# Patient Record
Sex: Male | Born: 1995 | Race: White | Hispanic: No | Marital: Single | Smoking: Light tobacco smoker
Health system: Southern US, Community
[De-identification: ages and names within clinical notes are randomized; demographics above are authoritative.]

---
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
## 2019-09-05 ENCOUNTER — Telehealth: Payer: Self-pay

## 2019-09-05 NOTE — Telephone Encounter (Signed)
OK to see. 

## 2019-09-05 NOTE — Telephone Encounter (Signed)
Copied from [place] [id number]. Topic: General - Inquiry >> Sep 04, 2019 12:27 PM [name] wrote: Reason for CRM: Patient has not been seen since 2016 and would like to continue seeing Dr Joseph Hashimoto . Please advise

## 2019-09-08 NOTE — Telephone Encounter (Signed)
Patient is scheduled for January 4 at 10 AM.

## 2019-10-27 ENCOUNTER — Other Ambulatory Visit: Payer: Self-pay

## 2019-10-27 ENCOUNTER — Encounter: Payer: Self-pay | Admitting: Family Medicine

## 2019-10-27 ENCOUNTER — Ambulatory Visit (INDEPENDENT_AMBULATORY_CARE_PROVIDER_SITE_OTHER): Payer: 59 | Admitting: Family Medicine

## 2019-10-27 VITALS — BP 122/80 | HR 92 | Temp 97.9°F | Ht 71.5 in | Wt 197.4 lb

## 2019-10-27 DIAGNOSIS — L729 Follicular cyst of the skin and subcutaneous tissue, unspecified: Secondary | ICD-10-CM | POA: Diagnosis not present

## 2019-10-27 DIAGNOSIS — R3915 Urgency of urination: Secondary | ICD-10-CM

## 2019-10-27 DIAGNOSIS — H9190 Unspecified hearing loss, unspecified ear: Secondary | ICD-10-CM

## 2019-10-27 DIAGNOSIS — M62838 Other muscle spasm: Secondary | ICD-10-CM | POA: Diagnosis not present

## 2019-10-27 MED ORDER — METHOCARBAMOL 500 MG PO TABS: 500.0000 mg | ORAL_TABLET | Freq: Three times a day (TID) | ORAL | 1 refills | Status: DC | PRN

## 2019-10-27 MED ORDER — OXYBUTYNIN CHLORIDE 5 MG PO TABS: 5.0000 mg | ORAL_TABLET | Freq: Three times a day (TID) | ORAL | 3 refills | Status: AC | PRN

## 2019-10-27 NOTE — Progress Notes (Signed)
Subjective:     Patient ID: Joseph Sutton, male   DOB: Jan 09, 1996, 24 y.o.   MRN: [id number]  HPI Joseph is seen to reestablish care.  Last seen here over 3 years ago.  He has history of some urinary urgency and is taken low-dose oxybutynin 5 mg once daily in the past.  He states he takes this generally about 4 times per week.  Requesting refills.  He relates that he has had some sensation of pulse or heartbeat noted in his left ear but no tinnitus.  He states he sometimes has sensation of "popping "in both ears.  He thinks he may have had some gradual decline in hearing of both ears in recent years.  Denies any complicated ear infections.  No vertigo.  No sudden hearing changes.  Third issue is he states that he noticed small cystic-like swelling right testicle recently back in October on exam.  No significant pain.  What he was palpating seem to be separate from the testicle itself.  Finally, he has had some tightness in the muscles left side of neck radiating toward the sternum for the past 2 years.  He has seen physical therapist and had some treatments that did not seem to help.  His father had sample of Robaxin 500 mg which seemed to help.  History reviewed. No pertinent past medical history. History reviewed. No pertinent surgical history.  reports that he has been smoking. He has never used smokeless tobacco. He reports current alcohol use of about 1.0 standard drinks of alcohol per week. He reports that he does not use drugs. family history is not on file. No Known Allergies   Review of Systems  Constitutional: Negative for chills and fever.  HENT: Negative for congestion, ear discharge, ear pain, sinus pressure and sinus pain.   Respiratory: Negative for cough and shortness of breath.   Cardiovascular: Negative for chest pain.  Genitourinary: Positive for urgency. Negative for dysuria and testicular pain.  Neurological: Negative for dizziness and headaches.  Hematological:  Negative for adenopathy.       Objective:   Physical Exam Vitals reviewed.  Constitutional:      Appearance: Normal appearance.  HENT:     Right Ear: Tympanic membrane normal.     Left Ear: Tympanic membrane normal.     Ears:     Comments: He has minimal nonobstructing cerumen bilaterally removed with curette without difficulty Neck:     Comments: No carotid bruits Cardiovascular:     Rate and Rhythm: Normal rate and regular rhythm.     Heart sounds: No murmur.  Pulmonary:     Effort: Pulmonary effort is normal.     Breath sounds: Normal breath sounds.  Genitourinary:    Testes: Normal.     Comments: cannot appreciate any testicular masses. Musculoskeletal:     Cervical back: Normal range of motion and neck supple. No rigidity.  Lymphadenopathy:     Cervical: No cervical adenopathy.  Neurological:     Mental Status: He is alert.        Assessment:     #1 chronic urinary urgency which has been treated in the past with low-dose Ditropan as needed  #2 question of subjective bilateral hearing loss over the past couple years  #3 concern for small right scrotal testicular swelling/cyst with normal exam today.  We explained that there are multiple benign cystic swelling such as epididymis cyst, spermatocele, varicocele, hydrocele  #4 muscle tension left neck and upper back  Plan:     -Refill oxybutynin 5 mg to take once or twice daily as needed for urine urgency  -We advised that he get set up to see audiologist for hearing assessment and he will check with insurance first  -Recommend conservative measures for his muscle tension including regular aerobic exercise, adequate sleep, adequate hydration, muscle massage.  We also agreed to trial of Robaxin 500 mg every 8 hours as needed for muscle spasm.  He is aware this may cause some sedation  -Reassurance regarding testicular exam but follow-up for any pain or other concerns  Kristian Covey MD [facility] at [facility]

## 2019-10-27 NOTE — Patient Instructions (Signed)
Recommend Audiology referral to evaluate hearing

## 2019-11-15 ENCOUNTER — Encounter: Payer: Self-pay | Admitting: Family Medicine

## 2019-11-17 ENCOUNTER — Other Ambulatory Visit: Payer: Self-pay

## 2019-11-17 MED ORDER — CYCLOBENZAPRINE HCL 10 MG PO TABS: 10.0000 mg | ORAL_TABLET | Freq: Every evening | ORAL | 0 refills | Status: DC | PRN

## 2019-12-19 ENCOUNTER — Other Ambulatory Visit: Payer: Self-pay | Admitting: Family Medicine

## 2019-12-20 NOTE — Telephone Encounter (Signed)
May refill once. 

## 2020-01-21 ENCOUNTER — Encounter: Payer: Self-pay | Admitting: Family Medicine

## 2020-02-13 ENCOUNTER — Other Ambulatory Visit: Payer: Self-pay

## 2020-02-13 ENCOUNTER — Ambulatory Visit (INDEPENDENT_AMBULATORY_CARE_PROVIDER_SITE_OTHER): Payer: 59 | Admitting: Family Medicine

## 2020-02-13 ENCOUNTER — Encounter: Payer: Self-pay | Admitting: Family Medicine

## 2020-02-13 VITALS — BP 118/68 | HR 105 | Temp 98.2°F | Wt 196.7 lb

## 2020-02-13 DIAGNOSIS — L729 Follicular cyst of the skin and subcutaneous tissue, unspecified: Secondary | ICD-10-CM | POA: Diagnosis not present

## 2020-02-13 DIAGNOSIS — R6882 Decreased libido: Secondary | ICD-10-CM

## 2020-02-13 DIAGNOSIS — R5383 Other fatigue: Secondary | ICD-10-CM

## 2020-02-13 DIAGNOSIS — M238X2 Other internal derangements of left knee: Secondary | ICD-10-CM

## 2020-02-13 LAB — TSH: TSH: 1.21 u[IU]/mL (ref 0.35–4.50)

## 2020-02-13 LAB — TESTOSTERONE: Testosterone: 273.14 ng/dL — ABNORMAL LOW (ref 300.00–890.00)

## 2020-02-13 NOTE — Progress Notes (Signed)
  Subjective:     Patient ID: Joseph Sutton, male   DOB: [birth year], 24 y.o.   MRN: [id number]  HPI Joseph is seen for the following issues  He got married 3 weeks ago.  He has noted over the past few weeks he has had some decreased libido and even a couple episodes of erectile dysfunction.  He did not recall any libido problems prior to his marriage.  He states that marriage is going well.  His wife has been very understanding.  He has had some mild fatigue issues.  He specifically would like to get his testosterone checked.  He has not noted any alopecia or other concerns.  Non-smoker.  Generally very healthy.  No regular medications.  Separate issue is he had some small cystic type lesions on his scrotum which are asymptomatic.  These are not painful or pruritic.  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  He has noted somewhat of a "popping "sound left knee since then.  No instability.  No knee pain.  No swelling.  No ecchymosis.  No effusion.  No past medical history on file. No past surgical history on file.  reports that he has been smoking. He has never used smokeless tobacco. He reports current alcohol use of about 1.0 standard drinks of alcohol per week. He reports that he does not use drugs. family history includes Cancer in his maternal grandfather; Heart disease in his maternal grandfather; High Cholesterol in his maternal grandfather and mother; Miscarriages / [place] in his mother. No Known Allergies   Review of Systems  Constitutional: Positive for fatigue. Negative for chills and fever.  Respiratory: Negative for shortness of breath.   Cardiovascular: Negative for chest pain.  Hematological: Negative for adenopathy.  Psychiatric/Behavioral: Negative for dysphoric mood.       Objective:   Physical Exam Vitals reviewed.  Constitutional:      Appearance: Normal appearance.  Cardiovascular:     Rate and Rhythm: Normal rate and regular rhythm.      Heart sounds: Normal heart sounds. No murmur.  Pulmonary:     Effort: Pulmonary effort is normal.     Breath sounds: Normal breath sounds.  Skin:    Comments: He has some very small approximately 1 mm whitish colored benign-appearing cyst of the scrotum  Neurological:     Mental Status: He is alert.        Assessment:     #1 increased fatigue and low libido.  We explained testosterone deficiency would be low his age but needs to be screened.  #2 benign-appearing scrotal cyst  #3 patient describes a popping sound of the left knee.  He does not report any injury and has not had any concerning findings such as redness, warmth, instability, pain    Plan:     -Reassurance regarding scrotal cysts -We recommend checking early morning testosterone level and TSH -If testosterone is low would repeat this but also check prolactin level -If levels above are normal would consider possible counseling options -Observation regarding left knee symptoms and follow-up for any pain, swelling, or other concerns  Kristian Covey MD [facility] at [facility]

## 2020-02-16 ENCOUNTER — Encounter: Payer: Self-pay | Admitting: Family Medicine

## 2020-02-27 ENCOUNTER — Other Ambulatory Visit: Payer: Self-pay

## 2020-03-01 ENCOUNTER — Other Ambulatory Visit (INDEPENDENT_AMBULATORY_CARE_PROVIDER_SITE_OTHER): Payer: 59

## 2020-03-01 ENCOUNTER — Other Ambulatory Visit: Payer: Self-pay

## 2020-03-01 DIAGNOSIS — R6882 Decreased libido: Secondary | ICD-10-CM

## 2020-03-01 LAB — TESTOSTERONE: Testosterone: 414.3 ng/dL (ref 300.00–890.00)

## 2020-03-01 LAB — PROLACTIN: Prolactin: 7.6 ng/mL (ref 2.0–18.0)

## 2020-03-09 ENCOUNTER — Encounter: Payer: Self-pay | Admitting: Family Medicine

## 2020-05-06 ENCOUNTER — Encounter: Payer: Self-pay | Admitting: Family Medicine

## 2020-05-11 MED ORDER — TIZANIDINE HCL 2 MG PO CAPS: 2.0000 mg | ORAL_CAPSULE | Freq: Three times a day (TID) | ORAL | 0 refills | Status: AC | PRN

## 2020-05-11 MED ORDER — TIZANIDINE HCL 2 MG PO CAPS: 2.0000 mg | ORAL_CAPSULE | Freq: Three times a day (TID) | ORAL | 0 refills | Status: DC | PRN

## 2020-08-10 ENCOUNTER — Encounter: Payer: Self-pay | Admitting: Family Medicine

## 2020-08-10 DIAGNOSIS — R6882 Decreased libido: Secondary | ICD-10-CM

## 2020-08-12 LAB — TESTOSTERONE: Testosterone: 632

## 2020-08-13 ENCOUNTER — Telehealth: Payer: Self-pay

## 2020-08-13 ENCOUNTER — Other Ambulatory Visit: Payer: Self-pay

## 2020-08-13 NOTE — Telephone Encounter (Signed)
Called pt and lvm for pt return our call about his lab results, Pt had his lab done at [facility]. Per Dr.Burchette pt testosterone Level was Normal. Copy at lab [place] ,CMA 's  Desk .

## 2021-04-06 ENCOUNTER — Encounter: Payer: Self-pay | Admitting: Family Medicine

## 2021-04-20 MED ORDER — HYDROCORTISONE 2.5 % EX CREA: TOPICAL_CREAM | Freq: Two times a day (BID) | CUTANEOUS | 2 refills | Status: AC

## 2021-04-20 MED ORDER — FLUOCINONIDE EMULSIFIED BASE 0.05 % EX CREA: 1.0000 "application " | TOPICAL_CREAM | Freq: Two times a day (BID) | CUTANEOUS | 2 refills | Status: DC

## 2021-05-12 ENCOUNTER — Encounter: Payer: Self-pay | Admitting: Family Medicine

## 2021-07-01 ENCOUNTER — Encounter: Payer: Self-pay | Admitting: Family Medicine

## 2021-07-01 MED ORDER — FLUOCINONIDE 0.05 % EX SOLN: 1.0000 "application " | Freq: Two times a day (BID) | CUTANEOUS | 2 refills | Status: AC | PRN
